# Patient Record
Sex: Female | Born: 1965 | Race: White | Hispanic: No | Marital: Single
Health system: Southern US, Community
[De-identification: ages and names within clinical notes are randomized; demographics above are authoritative.]

---

## 2005-11-12 ENCOUNTER — Ambulatory Visit: Payer: Self-pay | Admitting: Obstetrics & Gynecology

## 2005-11-12 ENCOUNTER — Other Ambulatory Visit: Admission: RE | Admit: 2005-11-12 | Discharge: 2005-11-12 | Payer: Self-pay | Admitting: Obstetrics & Gynecology

## 2005-12-24 ENCOUNTER — Ambulatory Visit: Payer: Self-pay | Admitting: Obstetrics and Gynecology

## 2011-09-25 ENCOUNTER — Emergency Department: Payer: Self-pay | Admitting: *Deleted

## 2012-08-01 ENCOUNTER — Emergency Department: Payer: Self-pay | Admitting: Emergency Medicine

## 2013-07-11 IMAGING — CT CT HEAD WITHOUT CONTRAST
1 series · 16 of 30 positions shown, 20 images · non-contrast
Comparison: none

REASON FOR EXAM: headache, sudden onset, severe
COMMENTS:

[Series 2: soft tissue · axial · 0.43mm/px · z∈[-126,+9]mm · 16 of 30 slices shown, 20 images]
[im 2/30  brain]
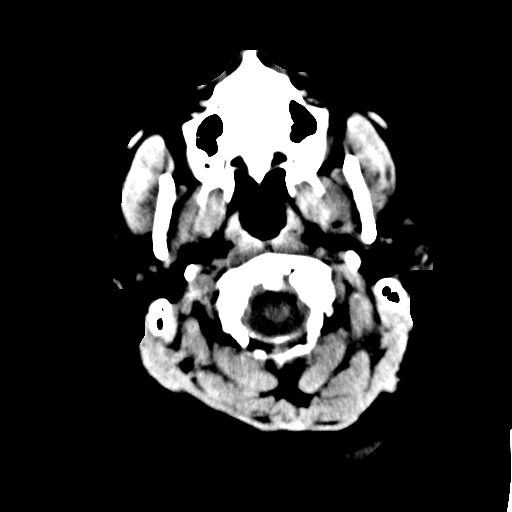
[im 2/30  bone]
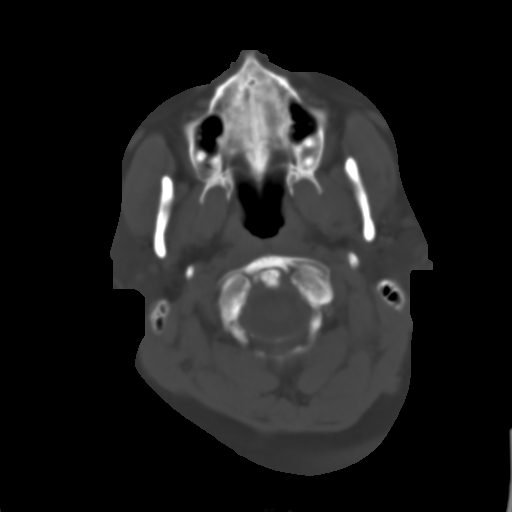
[im 4/30  brain]
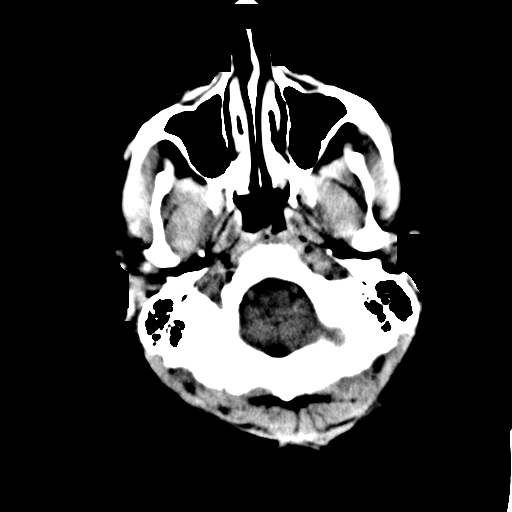
[im 6/30  brain]
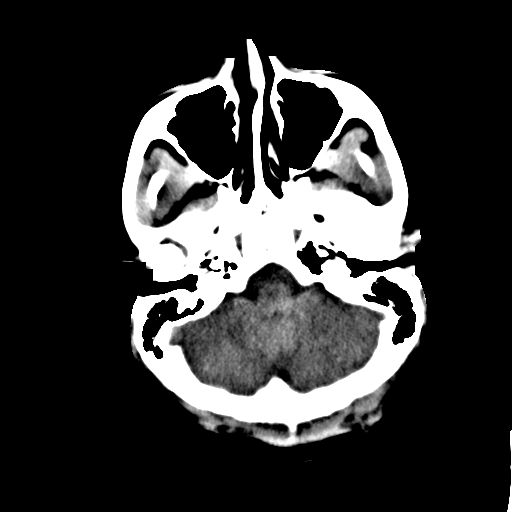
[im 8/30  brain]
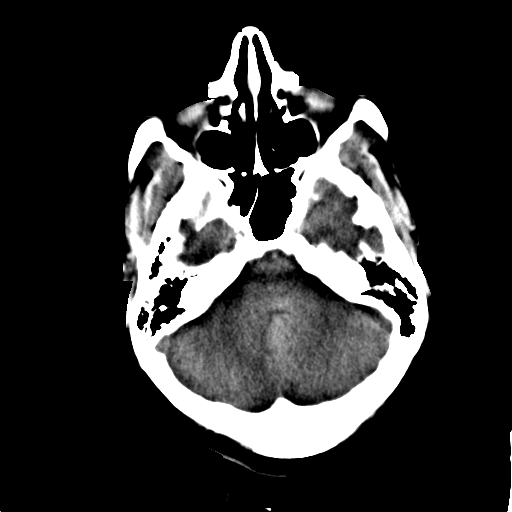
[im 9/30  brain]
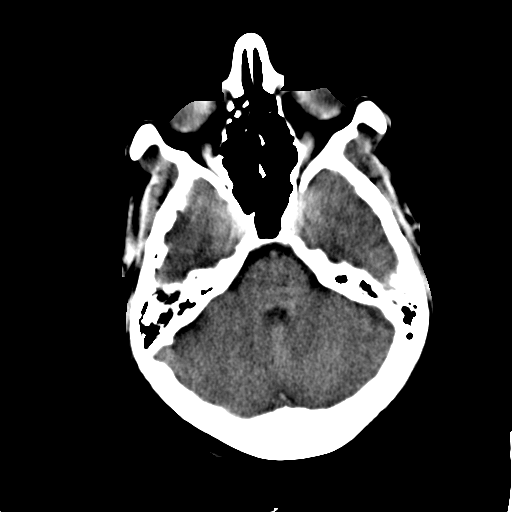
[im 9/30  bone]
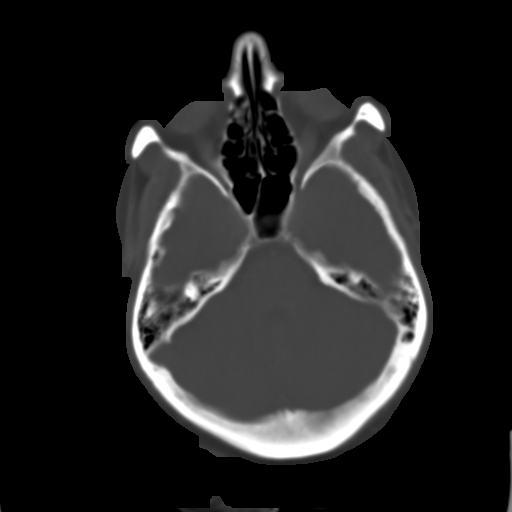
[im 11/30  brain]
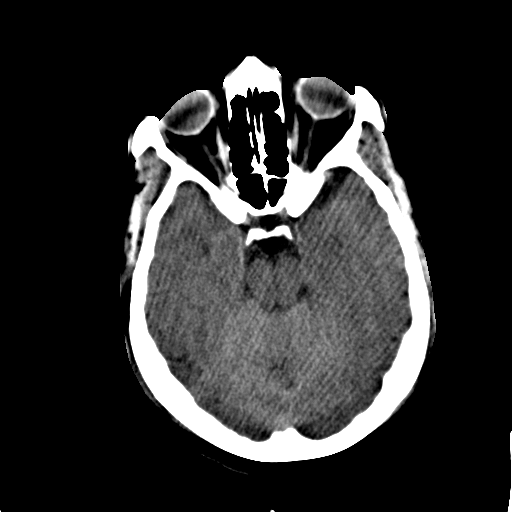
[im 13/30  brain]
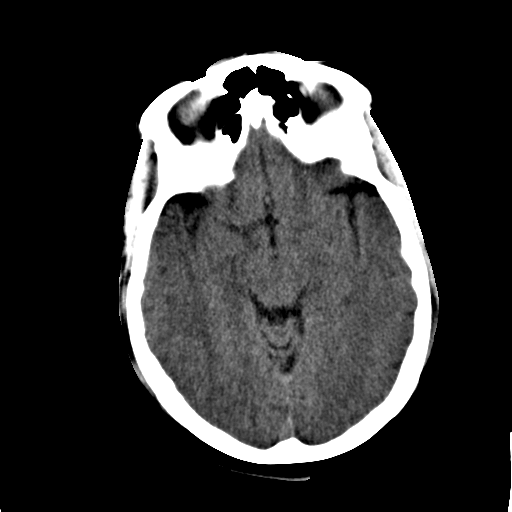
[im 15/30  brain]
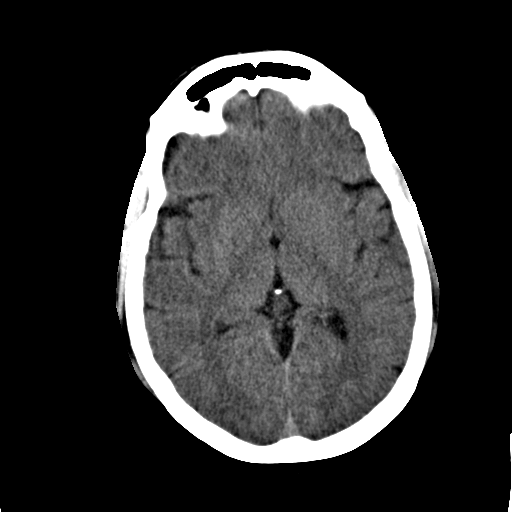
[im 16/30  brain]
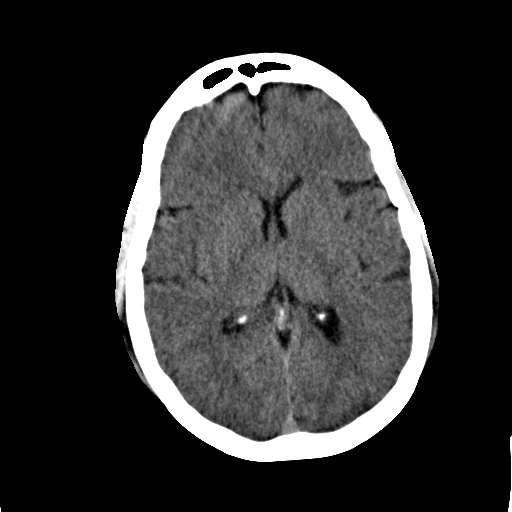
[im 16/30  bone]
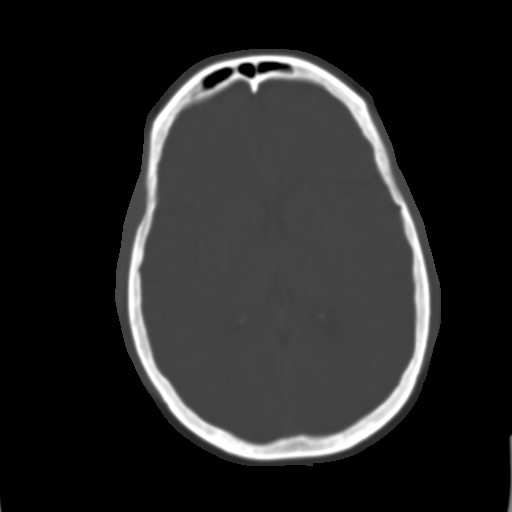
[im 18/30  brain]
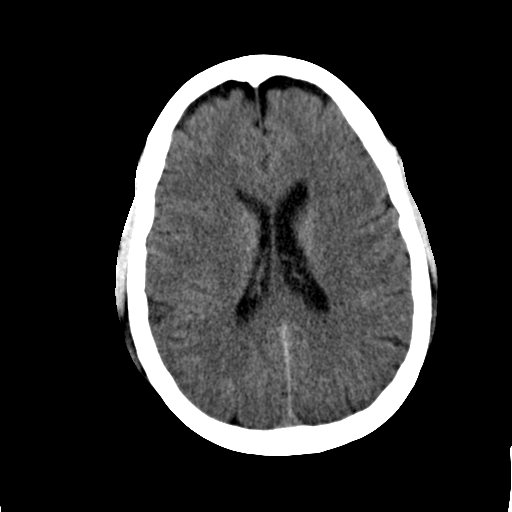
[im 20/30  brain]
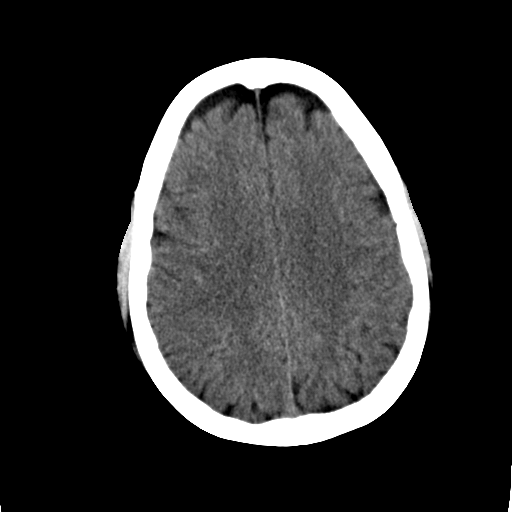
[im 22/30  brain]
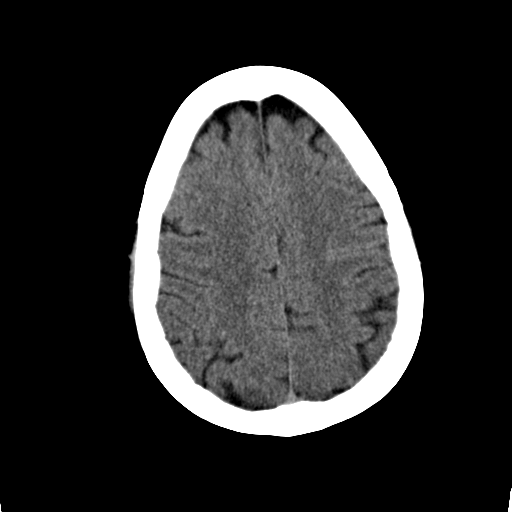
[im 23/30  brain]
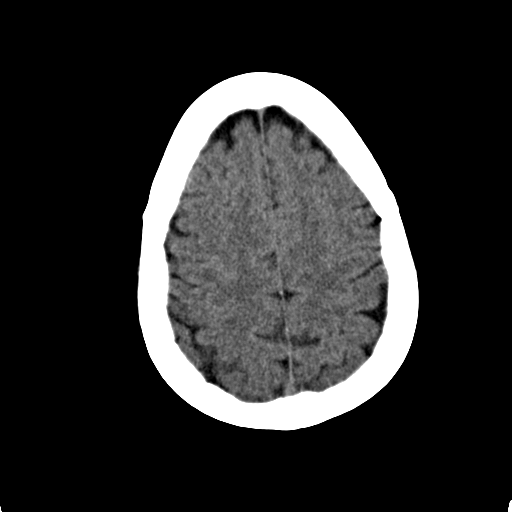
[im 23/30  bone]
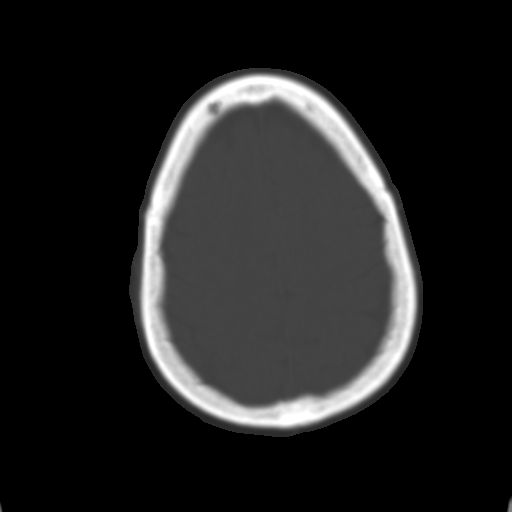
[im 25/30  brain]
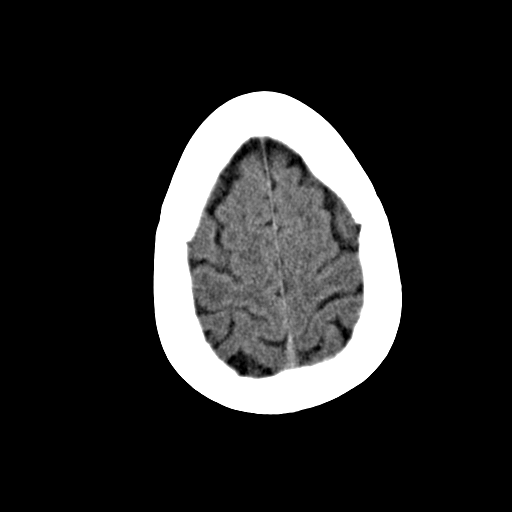
[im 27/30  brain]
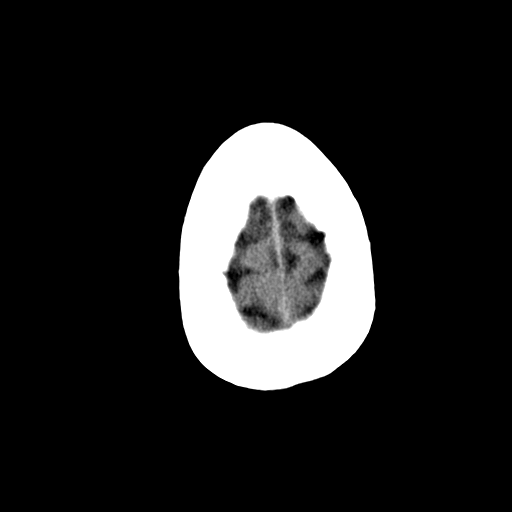
[im 29/30  brain]
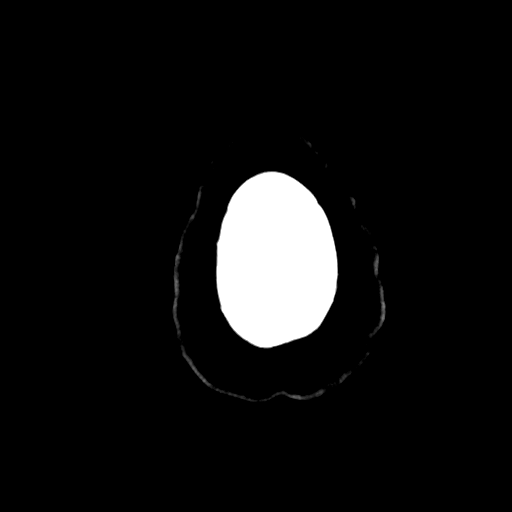

[16 of 30 positions shown; findings below may reference images not displayed]

PROCEDURE:     CT  - CT HEAD WITHOUT CONTRAST  - August 01, 2012 [DATE]

RESULT:     Axial noncontrast CT scanning was performed through the brain
with reconstructions at 5 mm intervals and slice thicknesses.

The ventricles are normal in size and position. There is mild bifrontal
cerebral atrophy. There is no intracranial hemorrhage nor intracranial mass
effect. The cerebellum and brainstem are normal in density. There is no
evidence of an evolving ischemic infarction. At bone window settings the
observed portions of the paranasal sinuses and mastoid air cells are clear.
There is no evidence of an acute skull fracture.
IMPRESSION: There is no acute intracranial abnormality.

[REDACTED]

## 2016-09-25 DIAGNOSIS — Z862 Personal history of diseases of the blood and blood-forming organs and certain disorders involving the immune mechanism: Secondary | ICD-10-CM | POA: Diagnosis not present

## 2020-03-02 DIAGNOSIS — F411 Generalized anxiety disorder: Secondary | ICD-10-CM | POA: Diagnosis not present

## 2020-03-02 DIAGNOSIS — Z1331 Encounter for screening for depression: Secondary | ICD-10-CM | POA: Diagnosis not present

## 2020-03-02 DIAGNOSIS — Z6834 Body mass index (BMI) 34.0-34.9, adult: Secondary | ICD-10-CM | POA: Diagnosis not present

## 2020-03-02 DIAGNOSIS — G629 Polyneuropathy, unspecified: Secondary | ICD-10-CM | POA: Diagnosis not present

## 2020-04-09 DIAGNOSIS — R030 Elevated blood-pressure reading, without diagnosis of hypertension: Secondary | ICD-10-CM | POA: Diagnosis not present

## 2020-04-09 DIAGNOSIS — E782 Mixed hyperlipidemia: Secondary | ICD-10-CM | POA: Diagnosis not present

## 2020-04-09 DIAGNOSIS — Z1211 Encounter for screening for malignant neoplasm of colon: Secondary | ICD-10-CM | POA: Diagnosis not present

## 2020-04-09 DIAGNOSIS — Z0000 Encounter for general adult medical examination without abnormal findings: Secondary | ICD-10-CM | POA: Diagnosis not present

## 2020-04-09 DIAGNOSIS — Z1231 Encounter for screening mammogram for malignant neoplasm of breast: Secondary | ICD-10-CM | POA: Diagnosis not present

## 2020-04-23 DIAGNOSIS — Z1212 Encounter for screening for malignant neoplasm of rectum: Secondary | ICD-10-CM | POA: Diagnosis not present

## 2020-04-23 DIAGNOSIS — Z1211 Encounter for screening for malignant neoplasm of colon: Secondary | ICD-10-CM | POA: Diagnosis not present

## 2020-05-04 DIAGNOSIS — Z1231 Encounter for screening mammogram for malignant neoplasm of breast: Secondary | ICD-10-CM | POA: Diagnosis not present

## 2020-05-04 DIAGNOSIS — M8589 Other specified disorders of bone density and structure, multiple sites: Secondary | ICD-10-CM | POA: Diagnosis not present

## 2020-05-04 DIAGNOSIS — E2839 Other primary ovarian failure: Secondary | ICD-10-CM | POA: Diagnosis not present

## 2020-09-11 DIAGNOSIS — F411 Generalized anxiety disorder: Secondary | ICD-10-CM | POA: Diagnosis not present

## 2020-09-11 DIAGNOSIS — E782 Mixed hyperlipidemia: Secondary | ICD-10-CM | POA: Diagnosis not present

## 2020-09-11 DIAGNOSIS — G629 Polyneuropathy, unspecified: Secondary | ICD-10-CM | POA: Diagnosis not present

## 2020-09-14 DIAGNOSIS — E782 Mixed hyperlipidemia: Secondary | ICD-10-CM | POA: Diagnosis not present

## 2021-05-16 DIAGNOSIS — F411 Generalized anxiety disorder: Secondary | ICD-10-CM | POA: Diagnosis not present

## 2021-05-16 DIAGNOSIS — E782 Mixed hyperlipidemia: Secondary | ICD-10-CM | POA: Diagnosis not present

## 2021-05-16 DIAGNOSIS — Z1331 Encounter for screening for depression: Secondary | ICD-10-CM | POA: Diagnosis not present

## 2021-05-16 DIAGNOSIS — G629 Polyneuropathy, unspecified: Secondary | ICD-10-CM | POA: Diagnosis not present

## 2021-08-16 DIAGNOSIS — G4710 Hypersomnia, unspecified: Secondary | ICD-10-CM | POA: Diagnosis not present

## 2021-08-16 DIAGNOSIS — E782 Mixed hyperlipidemia: Secondary | ICD-10-CM | POA: Diagnosis not present

## 2021-08-16 DIAGNOSIS — Z79899 Other long term (current) drug therapy: Secondary | ICD-10-CM | POA: Diagnosis not present

## 2021-08-16 DIAGNOSIS — G629 Polyneuropathy, unspecified: Secondary | ICD-10-CM | POA: Diagnosis not present

## 2021-08-16 DIAGNOSIS — F411 Generalized anxiety disorder: Secondary | ICD-10-CM | POA: Diagnosis not present

## 2021-08-28 DIAGNOSIS — Z1231 Encounter for screening mammogram for malignant neoplasm of breast: Secondary | ICD-10-CM | POA: Diagnosis not present

## 2021-09-09 DIAGNOSIS — G4733 Obstructive sleep apnea (adult) (pediatric): Secondary | ICD-10-CM | POA: Diagnosis not present

## 2021-09-18 DIAGNOSIS — G4733 Obstructive sleep apnea (adult) (pediatric): Secondary | ICD-10-CM | POA: Diagnosis not present

## 2021-10-19 DIAGNOSIS — G4733 Obstructive sleep apnea (adult) (pediatric): Secondary | ICD-10-CM | POA: Diagnosis not present

## 2021-11-15 DIAGNOSIS — E782 Mixed hyperlipidemia: Secondary | ICD-10-CM | POA: Diagnosis not present

## 2021-11-15 DIAGNOSIS — G4730 Sleep apnea, unspecified: Secondary | ICD-10-CM | POA: Diagnosis not present

## 2021-11-15 DIAGNOSIS — F411 Generalized anxiety disorder: Secondary | ICD-10-CM | POA: Diagnosis not present

## 2021-11-15 DIAGNOSIS — G629 Polyneuropathy, unspecified: Secondary | ICD-10-CM | POA: Diagnosis not present

## 2021-11-15 DIAGNOSIS — Z79899 Other long term (current) drug therapy: Secondary | ICD-10-CM | POA: Diagnosis not present

## 2021-11-18 DIAGNOSIS — G4733 Obstructive sleep apnea (adult) (pediatric): Secondary | ICD-10-CM | POA: Diagnosis not present

## 2022-03-10 DIAGNOSIS — E782 Mixed hyperlipidemia: Secondary | ICD-10-CM | POA: Diagnosis not present

## 2022-03-10 DIAGNOSIS — G4730 Sleep apnea, unspecified: Secondary | ICD-10-CM | POA: Diagnosis not present

## 2022-03-10 DIAGNOSIS — G629 Polyneuropathy, unspecified: Secondary | ICD-10-CM | POA: Diagnosis not present

## 2022-03-10 DIAGNOSIS — Z79899 Other long term (current) drug therapy: Secondary | ICD-10-CM | POA: Diagnosis not present

## 2022-03-10 DIAGNOSIS — F411 Generalized anxiety disorder: Secondary | ICD-10-CM | POA: Diagnosis not present

## 2022-03-12 DIAGNOSIS — G4733 Obstructive sleep apnea (adult) (pediatric): Secondary | ICD-10-CM | POA: Diagnosis not present

## 2022-03-19 DIAGNOSIS — B070 Plantar wart: Secondary | ICD-10-CM | POA: Diagnosis not present

## 2022-04-07 ENCOUNTER — Ambulatory Visit (INDEPENDENT_AMBULATORY_CARE_PROVIDER_SITE_OTHER): Payer: BC Managed Care – PPO | Admitting: Podiatry

## 2022-04-07 ENCOUNTER — Encounter: Payer: Self-pay | Admitting: Podiatry

## 2022-04-07 DIAGNOSIS — L84 Corns and callosities: Secondary | ICD-10-CM

## 2022-04-07 DIAGNOSIS — B070 Plantar wart: Secondary | ICD-10-CM | POA: Diagnosis not present

## 2022-04-07 DIAGNOSIS — B353 Tinea pedis: Secondary | ICD-10-CM

## 2022-04-07 NOTE — Progress Notes (Signed)
Subjective:  Patient ID: Sophia Kelly, female    DOB: April 22, 1966,  MRN: [id number]  Chief Complaint  Patient presents with   Plantar Warts    Right plantar wart to ball of foot under 3rd toe. Appeared around August 16. Patient was seen at urgent care and was prescribed antibiotics and she has completed course of antibiotics. Painful.     56 y.o. female presents with patient presents for concern for prior white raised bump on the plantar aspect of her left third toe.  This happened approximately 1 to 2 weeks ago.  She had significant pain with this raised white bump.  She put some antifungal lotion on the area thinking it was an athlete's foot type issue.  She says some skin then peeled off from underneath the third toe.  Since that happened the pain has slowly gone away.  There is less redness and swelling.  She also went to an urgent care who put her on a course of cephalexin.  She says she is finished the cephalexin and this also helped with the pain redness and swelling of the area.  She says there is no longer any pain really where she had it before but she want to keep the appointment to make sure nothing else needed to be done.   History reviewed. No pertinent past medical history.  Allergies  Allergen Reactions   Codeine Hives    ROS: Negative except as per HPI above  Objective:  General: AAO x3, NAD  Dermatological: With inspection and palpation of the left foot third toe there is noted to be some area of inflammation and an area of new epidermal tissue with recent peeling of the overlying epidermis.  There is no open wound no obvious lesion callus or verruca present at the plantar aspect of the proximal phalanx of the third toe where the lesion was previously.  Area is nontender to palpation the toe is minimally edematous and there is no frank erythema present.  No rash or interdigital maceration to suggest interdigital tinea pedis.  Vascular:  Dorsalis Pedis artery and Posterior  Tibial artery pedal pulses are 2/4 bilateral.  Capillary fill time brisk < 3 sec. Pedal hair growth present. No varicosities and no lower extremity edema present bilateral. There is no pain with calf compression, swelling, warmth, erythema.   Neruologic: Grossly intact via light touch bilateral. Protective threshold intact to all sites bilateral. Patellar and Achilles deep tendon reflexes 2+ bilateral. Negative Babinski reflex.   Musculoskeletal: No gross boney pedal deformities bilateral. No pain, crepitus, or limitation noted with foot and ankle range of motion bilateral. Muscular strength 5/5 in all groups tested bilateral.  Gait: Unassisted, Nonantalgic.   No images are attached to the encounter.  Assessment:   1. Tinea pedis of left foot   2. Callus of toe   3. Verruca plantaris      Plan:  Patient was evaluated and treated and all questions answered.  #Athlete's foot/interdigital tinea pedis left foot versus verruca plantaris of the third toe versus inflamed callus/porokeratosis now resolved after antibiotic therapy and antifungal lotion application -Discussed with the patient that what ever lesion was present no longer appears to be present at the base of the third toe.  Upon light debridement with a 15 blade there is no obvious pinpoint bleeding or hyperkeratotic lesion to suggest callus or verruca.  My suspicion is that this was some type of macerated callus related to interdigital tinea pedis which is since resolved after the  use of Lamisil antifungal lotion as well as Keflex.  I recommend she continue monitoring the area for any sign of recurrence.  But as the area is not painful and there does not appear to be any significant tinea pedis present we will defer treatment for now.  She can use antifungal powder for moisture control as she does say her toes and feet get very sweaty.  Recommend using these antifungal foot powder in between the toes daily while working.  Patient will  follow-up as needed if the lesion recurs or the pain returns.  Return if symptoms worsen or fail to improve.          Corinna Gab, DPM [facility] / [facility]

## 2022-06-27 DIAGNOSIS — G4733 Obstructive sleep apnea (adult) (pediatric): Secondary | ICD-10-CM | POA: Diagnosis not present

## 2022-07-18 DIAGNOSIS — Z79899 Other long term (current) drug therapy: Secondary | ICD-10-CM | POA: Diagnosis not present

## 2022-07-18 DIAGNOSIS — F411 Generalized anxiety disorder: Secondary | ICD-10-CM | POA: Diagnosis not present

## 2022-07-18 DIAGNOSIS — G629 Polyneuropathy, unspecified: Secondary | ICD-10-CM | POA: Diagnosis not present

## 2022-07-18 DIAGNOSIS — G4730 Sleep apnea, unspecified: Secondary | ICD-10-CM | POA: Diagnosis not present

## 2022-07-18 DIAGNOSIS — E782 Mixed hyperlipidemia: Secondary | ICD-10-CM | POA: Diagnosis not present

## 2022-08-29 DIAGNOSIS — Z1231 Encounter for screening mammogram for malignant neoplasm of breast: Secondary | ICD-10-CM | POA: Diagnosis not present

## 2022-10-16 DIAGNOSIS — R739 Hyperglycemia, unspecified: Secondary | ICD-10-CM | POA: Diagnosis not present

## 2022-10-16 DIAGNOSIS — G4730 Sleep apnea, unspecified: Secondary | ICD-10-CM | POA: Diagnosis not present

## 2022-10-16 DIAGNOSIS — E782 Mixed hyperlipidemia: Secondary | ICD-10-CM | POA: Diagnosis not present

## 2022-10-16 DIAGNOSIS — Z79899 Other long term (current) drug therapy: Secondary | ICD-10-CM | POA: Diagnosis not present

## 2022-10-16 DIAGNOSIS — F411 Generalized anxiety disorder: Secondary | ICD-10-CM | POA: Diagnosis not present

## 2022-10-16 DIAGNOSIS — G629 Polyneuropathy, unspecified: Secondary | ICD-10-CM | POA: Diagnosis not present

## 2022-12-24 DIAGNOSIS — G4733 Obstructive sleep apnea (adult) (pediatric): Secondary | ICD-10-CM | POA: Diagnosis not present

## 2023-01-15 DIAGNOSIS — F411 Generalized anxiety disorder: Secondary | ICD-10-CM | POA: Diagnosis not present

## 2023-01-15 DIAGNOSIS — E782 Mixed hyperlipidemia: Secondary | ICD-10-CM | POA: Diagnosis not present

## 2023-01-15 DIAGNOSIS — G629 Polyneuropathy, unspecified: Secondary | ICD-10-CM | POA: Diagnosis not present

## 2023-01-15 DIAGNOSIS — Z79899 Other long term (current) drug therapy: Secondary | ICD-10-CM | POA: Diagnosis not present

## 2023-01-15 DIAGNOSIS — R739 Hyperglycemia, unspecified: Secondary | ICD-10-CM | POA: Diagnosis not present

## 2023-04-20 DIAGNOSIS — E782 Mixed hyperlipidemia: Secondary | ICD-10-CM | POA: Diagnosis not present

## 2023-04-20 DIAGNOSIS — Z79899 Other long term (current) drug therapy: Secondary | ICD-10-CM | POA: Diagnosis not present

## 2023-04-20 DIAGNOSIS — G629 Polyneuropathy, unspecified: Secondary | ICD-10-CM | POA: Diagnosis not present

## 2023-04-20 DIAGNOSIS — Z6832 Body mass index (BMI) 32.0-32.9, adult: Secondary | ICD-10-CM | POA: Diagnosis not present

## 2023-04-20 DIAGNOSIS — R739 Hyperglycemia, unspecified: Secondary | ICD-10-CM | POA: Diagnosis not present

## 2023-04-29 DIAGNOSIS — G4733 Obstructive sleep apnea (adult) (pediatric): Secondary | ICD-10-CM | POA: Diagnosis not present

## 2023-08-12 DIAGNOSIS — G4733 Obstructive sleep apnea (adult) (pediatric): Secondary | ICD-10-CM | POA: Diagnosis not present

## 2023-09-21 DIAGNOSIS — M545 Low back pain, unspecified: Secondary | ICD-10-CM | POA: Diagnosis not present

## 2023-09-21 DIAGNOSIS — N182 Chronic kidney disease, stage 2 (mild): Secondary | ICD-10-CM | POA: Diagnosis not present

## 2023-09-21 DIAGNOSIS — E782 Mixed hyperlipidemia: Secondary | ICD-10-CM | POA: Diagnosis not present

## 2023-09-21 DIAGNOSIS — R739 Hyperglycemia, unspecified: Secondary | ICD-10-CM | POA: Diagnosis not present

## 2024-04-20 ENCOUNTER — Ambulatory Visit: Admitting: Podiatry

## 2024-06-30 DIAGNOSIS — R059 Cough, unspecified: Secondary | ICD-10-CM | POA: Diagnosis not present

## 2024-06-30 DIAGNOSIS — R509 Fever, unspecified: Secondary | ICD-10-CM | POA: Diagnosis not present

## 2024-06-30 DIAGNOSIS — R0981 Nasal congestion: Secondary | ICD-10-CM | POA: Diagnosis not present

## 2024-06-30 DIAGNOSIS — J111 Influenza due to unidentified influenza virus with other respiratory manifestations: Secondary | ICD-10-CM | POA: Diagnosis not present
